# Patient Record
Sex: Female | Born: 2000 | Race: Black or African American | Hispanic: No | Marital: Single | Smoking: Never smoker
Health system: Southern US, Community
[De-identification: ages and names within clinical notes are randomized; demographics above are authoritative.]

## PROBLEM LIST (undated history)

## (undated) DIAGNOSIS — D649 Anemia, unspecified: Secondary | ICD-10-CM

## (undated) DIAGNOSIS — F1290 Cannabis use, unspecified, uncomplicated: Secondary | ICD-10-CM

## (undated) DIAGNOSIS — J45909 Unspecified asthma, uncomplicated: Secondary | ICD-10-CM

## (undated) HISTORY — PX: BREAST BIOPSY: SHX20

---
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
## 2018-04-02 ENCOUNTER — Other Ambulatory Visit: Payer: Self-pay | Admitting: Emergency Medicine

## 2018-04-02 DIAGNOSIS — N63 Unspecified lump in unspecified breast: Secondary | ICD-10-CM

## 2018-04-07 ENCOUNTER — Ambulatory Visit
Admission: RE | Admit: 2018-04-07 | Discharge: 2018-04-07 | Disposition: A | Discharge status: Home / Self Care | Payer: Medicaid Other | Source: Ambulatory Visit | Attending: Emergency Medicine | Admitting: Emergency Medicine

## 2018-04-07 DIAGNOSIS — N63 Unspecified lump in unspecified breast: Secondary | ICD-10-CM | POA: Diagnosis not present

## 2018-04-20 ENCOUNTER — Other Ambulatory Visit: Payer: Self-pay | Admitting: Family

## 2018-04-20 DIAGNOSIS — D241 Benign neoplasm of right breast: Secondary | ICD-10-CM

## 2019-02-09 ENCOUNTER — Other Ambulatory Visit: Payer: Self-pay | Admitting: Family

## 2019-02-09 DIAGNOSIS — N6320 Unspecified lump in the left breast, unspecified quadrant: Secondary | ICD-10-CM

## 2019-02-09 DIAGNOSIS — D241 Benign neoplasm of right breast: Secondary | ICD-10-CM

## 2019-07-18 ENCOUNTER — Encounter (HOSPITAL_COMMUNITY): Payer: Self-pay | Admitting: *Deleted

## 2019-07-18 ENCOUNTER — Other Ambulatory Visit: Payer: Self-pay

## 2019-07-18 ENCOUNTER — Emergency Department (HOSPITAL_COMMUNITY)
Admission: EM | Admit: 2019-07-18 | Discharge: 2019-07-18 | Disposition: A | Discharge status: Home / Self Care | Payer: Medicaid Other | Attending: Emergency Medicine | Admitting: Emergency Medicine

## 2019-07-18 DIAGNOSIS — J029 Acute pharyngitis, unspecified: Secondary | ICD-10-CM | POA: Diagnosis not present

## 2019-07-18 LAB — GROUP A STREP BY PCR: Group A Strep by PCR: NOT DETECTED

## 2019-07-18 MED ORDER — IBUPROFEN 800 MG PO TABS: 800.0000 mg | ORAL_TABLET | Freq: Three times a day (TID) | ORAL | 0 refills | Status: DC | PRN

## 2019-07-18 MED ORDER — AMOXICILLIN 875 MG PO TABS: 875.0000 mg | ORAL_TABLET | Freq: Two times a day (BID) | ORAL | 0 refills | Status: DC

## 2019-07-18 NOTE — ED Triage Notes (Signed)
Sore throat for 2 days no temp until she arrived here lmp 2 14

## 2019-07-18 NOTE — ED Provider Notes (Signed)
[facility] EMERGENCY DEPARTMENT Provider Note   CSN: [id number] Arrival date & time: 07/18/19  1912     History Chief Complaint  Patient presents with  . Sore Throat    Tina Garcia is a 19 y.o. female.  HPI Patient presents to the emergency department with sore throat that started 2 days ago.  The patient states the pain has been increased with swallowing.  The patient states that she feels like her tonsils are swollen.  She states that nothing seems to make the condition better.  Patient states that she feels like she may have had some low-grade fever.  She states she has no other symptoms associated with this.    History reviewed. No pertinent past medical history.  There are no problems to display for this patient.   History reviewed. No pertinent surgical history.   OB History   No obstetric history on file.     No family history on file.  Social History   Tobacco Use  . Smoking status: Never Smoker  . Smokeless tobacco: Never Used  Substance Use Topics  . Alcohol use: No  . Drug use: Not on file    Home Medications Prior to Admission medications   Not on File    Allergies    Patient has no known allergies.  Review of Systems   Review of Systems All other systems negative except as documented in the HPI. All pertinent positives and negatives as reviewed in the HPI. Physical Exam Updated Vital Signs BP 108/79 (BP Location: Left Arm)   Pulse (!) 109   Temp (!) 100.4 F (38 C) (Oral)   Resp 16   Ht 5\' 2"  (1.575 m)   Wt 44.5 kg   SpO2 100%   BMI 17.92 kg/m   Physical Exam Vitals and nursing note reviewed.  Constitutional:      General: She is not in acute distress.    Appearance: She is well-developed.  HENT:     Head: Normocephalic and atraumatic.     Mouth/Throat:     Tonsils: Tonsillar exudate present. 2+ on the right. 2+ on the left.  Eyes:     Pupils: Pupils are equal, round, and reactive to light.   Pulmonary:     Effort: Pulmonary effort is normal.  Skin:    General: Skin is warm and dry.  Neurological:     Mental Status: She is alert and oriented to person, place, and time.     ED Results / Procedures / Treatments   Labs (all labs ordered are listed, but only abnormal results are displayed) Labs Reviewed  GROUP A STREP BY PCR    EKG None  Radiology No results found.  Procedures Procedures (including critical care time)  Medications Ordered in ED Medications - No data to display  ED Course  I have reviewed the triage vital signs and the nursing notes.  Pertinent labs & imaging results that were available during my care of the patient were reviewed by me and considered in my medical decision making (see chart for details).    MDM Rules/Calculators/A&P                      The patient appears to have a pharyngitis that I feel is consistent with a bacterial pharyngitis.  The strep test was negative but I still feel based on the physical exam findings she will need to be treated with antibiotics.  I advised the  patient of the plan and all questions were answered.  Patient agrees and no further questions by the patient. Final Clinical Impression(s) / ED Diagnoses Final diagnoses:  None    Rx / DC Orders ED Discharge Orders    None       Dalia Heading, PA-C 07/18/19 Bushong, Ankit, MD 07/20/19 0120

## 2019-07-18 NOTE — Discharge Instructions (Addendum)
Return here as needed.  Increase your fluid intake.

## 2019-11-30 ENCOUNTER — Other Ambulatory Visit: Payer: Self-pay

## 2019-11-30 ENCOUNTER — Emergency Department
Admission: EM | Admit: 2019-11-30 | Discharge: 2019-11-30 | Disposition: A | Discharge status: Home / Self Care | Payer: Medicaid Other | Attending: Student in an Organized Health Care Education/Training Program | Admitting: Student in an Organized Health Care Education/Training Program

## 2019-11-30 DIAGNOSIS — L0231 Cutaneous abscess of buttock: Secondary | ICD-10-CM | POA: Insufficient documentation

## 2019-11-30 LAB — POCT PREGNANCY, URINE: Preg Test, Ur: NEGATIVE

## 2019-11-30 MED ORDER — SULFAMETHOXAZOLE-TRIMETHOPRIM 800-160 MG PO TABS: 1.0000 | ORAL_TABLET | Freq: Two times a day (BID) | ORAL | 0 refills | Status: DC

## 2019-11-30 MED ORDER — HYDROCODONE-ACETAMINOPHEN 5-325 MG PO TABS: 1.0000 | ORAL_TABLET | Freq: Four times a day (QID) | ORAL | 0 refills | Status: DC | PRN

## 2019-11-30 NOTE — Discharge Instructions (Signed)
Follow-up with your primary care provider return to the emergency department if any severe worsening of your symptoms.  Begin taking sits baths or using warm moist compresses to the area frequently.  Begin taking antibiotics twice a day for the next 10 days and a prescription for pain medication was sent to your pharmacy.  Do not take the pain medication and drive or operate machinery as it could cause drowsiness and increase your risk for injury.  Area should continue to drain and improve.  If any severe worsening of your symptoms or fever, vomiting or inability to take antibiotics return to the emergency department.

## 2019-11-30 NOTE — ED Provider Notes (Signed)
[facility] Emergency Department Provider Note  ____________________________________________   First MD Initiated Contact with Patient 11/30/19 1010     (approximate)  I have reviewed the triage vital signs and the nursing notes.   HISTORY  Chief Complaint Abscess   HPI Tina Garcia is a 19 y.o. female presents to the ED with complaint of boil on her left buttocks for the last several days.  Patient is unaware of whether or not it is draining and she has had a Band-Aid on it the entire time.  She denies any fever, chills, nausea or vomiting.  Recently switched from Depo to the NuvaRing.  Her history indicates that she has not had a period Since 10/13/2019.   History reviewed. No pertinent past medical history.  There are no problems to display for this patient.   History reviewed. No pertinent surgical history.  Prior to Admission medications   Medication Sig Start Date End Date Taking? Authorizing Provider  HYDROcodone-acetaminophen (NORCO/VICODIN) 5-325 MG tablet Take 1 tablet by mouth every 6 (six) hours as needed for moderate pain. 11/30/19   Johnn Hai, PA-C  sulfamethoxazole-trimethoprim (BACTRIM DS) 800-160 MG tablet Take 1 tablet by mouth 2 (two) times daily. 11/30/19   Johnn Hai, PA-C    Allergies Patient has no known allergies.  No family history on file.  Social History Social History   Tobacco Use  . Smoking status: Never Smoker  . Smokeless tobacco: Never Used  Substance Use Topics  . Alcohol use: Yes    Comment: occassionally  . Drug use: Not on file    Review of Systems Constitutional: No fever/chills Eyes: No visual changes. Cardiovascular: Denies chest pain. Respiratory: Denies shortness of breath. Gastrointestinal:  No nausea, no vomiting.  Musculoskeletal: Negative for back pain. Skin: Positive for abscess. Neurological: Negative for headaches, focal weakness or  numbness. ____________________________________________   PHYSICAL EXAM:  VITAL SIGNS: ED Triage Vitals  Enc Vitals Group     BP 11/30/19 0913 114/78     Pulse Rate 11/30/19 0913 (!) 101     Resp 11/30/19 0913 16     Temp 11/30/19 0913 98.4 F (36.9 C)     Temp Source 11/30/19 0913 Oral     SpO2 11/30/19 0913 100 %     Weight 11/30/19 0914 94 lb (42.6 kg)     Height 11/30/19 0914 5\' 2"  (1.575 m)     Head Circumference --      Peak Flow --      Pain Score 11/30/19 0914 10     Pain Loc --      Pain Edu? --      Excl. in [place]? --     Constitutional: Alert and oriented. Well appearing and in no acute distress. Eyes: Conjunctivae are normal.  Head: Atraumatic. Neck: No stridor.   Cardiovascular: Normal rate, regular rhythm. Grossly normal heart sounds.  Good peripheral circulation. Respiratory: Normal respiratory effort.  No retractions. Lungs CTAB. Gastrointestinal: Soft and nontender. No distention.  Musculoskeletal: Is able move upper and lower extremities that any difficulty.  There is an abscess noted on the left buttocks as noted under skin exam.   Neurologic:  Normal speech and language. No gross focal neurologic deficits are appreciated. No gait instability. Skin:  Skin is warm, left buttocks there is a single open pustule with surrounding cellulitis with a circumference of approximately 3 cm.  Area is markedly tender to palpation.  Purulent drainage present. Psychiatric: Mood and  affect are normal. Speech and behavior are normal.  ____________________________________________   LABS (all labs ordered are listed, but only abnormal results are displayed)  Labs Reviewed  POC URINE PREG, ED  POCT PREGNANCY, URINE     PROCEDURES  Procedure(s) performed (including Critical Care):  Procedures   ____________________________________________   INITIAL IMPRESSION / ASSESSMENT AND PLAN / ED COURSE  As part of my medical decision making, I reviewed the following data  within the electronic MEDICAL RECORD NUMBER Notes from prior ED visits and [place] Controlled Substance Database  Cortlyn Cannell was evaluated in Emergency Department on 11/30/2019 for the symptoms described in the history of present illness. She was evaluated in the context of the global COVID-19 pandemic, which necessitated consideration that the patient might be at risk for infection with the SARS-CoV-2 virus that causes COVID-19. Institutional protocols and algorithms that pertain to the evaluation of patients at risk for COVID-19 are in a state of rapid change based on information released by regulatory bodies including the CDC and federal and state organizations. These policies and algorithms were followed during the patient's care in the ED.  19 year old female presents to the ED with complaint of possible abscess to her left buttocks.  Patient states it has been there for couple days.  On exam currently it is draining purulent material.  There is mild cellulitis directly around the pustule.  Patient was made aware that she should begin doing sits baths or just warm moist compresses to the area frequently.  A prescription for Bactrim and Norco was sent to her pharmacy.  She is return to the emergency department if any severe worsening of her symptoms or not improving.    ____________________________________________   FINAL CLINICAL IMPRESSION(S) / ED DIAGNOSES  Final diagnoses:  Abscess of buttock, left     ED Discharge Orders         Ordered    sulfamethoxazole-trimethoprim (BACTRIM DS) 800-160 MG tablet  2 times daily,   Status:  Discontinued     Reprint     11/30/19 1105    HYDROcodone-acetaminophen (NORCO/VICODIN) 5-325 MG tablet  Every 6 hours PRN,   Status:  Discontinued     Reprint     11/30/19 1105    HYDROcodone-acetaminophen (NORCO/VICODIN) 5-325 MG tablet  Every 6 hours PRN     Discontinue  Reprint     11/30/19 1106    sulfamethoxazole-trimethoprim (BACTRIM DS) 800-160 MG tablet   2 times daily     Discontinue  Reprint     11/30/19 1106           Note:  This document was prepared using Dragon voice recognition software and may include unintentional dictation errors.    Johnn Hai, PA-C 11/30/19 1123    Merlyn Lot, MD 11/30/19 1434

## 2019-11-30 NOTE — ED Triage Notes (Signed)
Pt c/o "boil" to the left gluteus for the past couple of days, denies any drainage.

## 2019-11-30 NOTE — ED Notes (Signed)
See triage note Presents with possible abscess to left buttock  Unsure of insect bite  Area is red ,swollen and draining slightly

## 2020-05-29 ENCOUNTER — Other Ambulatory Visit: Payer: Self-pay

## 2020-05-29 ENCOUNTER — Emergency Department
Admission: EM | Admit: 2020-05-29 | Discharge: 2020-05-29 | Disposition: A | Discharge status: Home / Self Care | Payer: Medicaid Other | Attending: Emergency Medicine | Admitting: Emergency Medicine

## 2020-05-29 ENCOUNTER — Encounter: Payer: Self-pay | Admitting: Emergency Medicine

## 2020-05-29 ENCOUNTER — Emergency Department: Payer: Medicaid Other

## 2020-05-29 DIAGNOSIS — R102 Pelvic and perineal pain: Secondary | ICD-10-CM

## 2020-05-29 DIAGNOSIS — R52 Pain, unspecified: Secondary | ICD-10-CM

## 2020-05-29 DIAGNOSIS — E860 Dehydration: Secondary | ICD-10-CM | POA: Insufficient documentation

## 2020-05-29 LAB — LIPASE, BLOOD: Lipase: 38 U/L (ref 11–51)

## 2020-05-29 LAB — COMPREHENSIVE METABOLIC PANEL
ALT: 16 U/L (ref 0–44)
AST: 23 U/L (ref 15–41)
Albumin: 4.4 g/dL (ref 3.5–5.0)
Alkaline Phosphatase: 54 U/L (ref 38–126)
Anion gap: 13 (ref 5–15)
BUN: 13 mg/dL (ref 6–20)
CO2: 23 mmol/L (ref 22–32)
Calcium: 8.9 mg/dL (ref 8.9–10.3)
Chloride: 101 mmol/L (ref 98–111)
Creatinine, Ser: 0.6 mg/dL (ref 0.44–1.00)
GFR, Estimated: >60 mL/min (ref >60)
Glucose, Bld: 104 mg/dL — ABNORMAL HIGH (ref 70–99)
Potassium: 3.7 mmol/L (ref 3.5–5.1)
Sodium: 137 mmol/L (ref 135–145)
Total Bilirubin: 1.2 mg/dL (ref 0.3–1.2)
Total Protein: 8.2 g/dL — ABNORMAL HIGH (ref 6.5–8.1)

## 2020-05-29 LAB — CHLAMYDIA/NGC RT PCR (ARMC ONLY)
Chlamydia Tr: NOT DETECTED
N gonorrhoeae: NOT DETECTED

## 2020-05-29 LAB — WET PREP, GENITAL
Clue Cells Wet Prep HPF POC: NONE SEEN
Sperm: NONE SEEN
Trich, Wet Prep: NONE SEEN
Yeast Wet Prep HPF POC: NONE SEEN

## 2020-05-29 LAB — POC URINE PREG, ED: Preg Test, Ur: NEGATIVE

## 2020-05-29 LAB — URINALYSIS, COMPLETE (UACMP) WITH MICROSCOPIC
Bacteria, UA: NONE SEEN
Bilirubin Urine: NEGATIVE
Glucose, UA: NEGATIVE mg/dL
Ketones, ur: 80 mg/dL — AB
Leukocytes,Ua: NEGATIVE
Nitrite: NEGATIVE
Protein, ur: NEGATIVE mg/dL
Specific Gravity, Urine: 1.035 — ABNORMAL HIGH (ref 1.005–1.030)
pH: 5 (ref 5.0–8.0)

## 2020-05-29 LAB — CBC
HCT: 40.1 % (ref 36.0–46.0)
Hemoglobin: 12.9 g/dL (ref 12.0–15.0)
MCH: 26 pg (ref 26.0–34.0)
MCHC: 32.2 g/dL (ref 30.0–36.0)
MCV: 80.8 fL (ref 80.0–100.0)
Platelets: 378 10*3/uL (ref 150–400)
RBC: 4.96 MIL/uL (ref 3.87–5.11)
RDW: 14 % (ref 11.5–15.5)
WBC: 9 10*3/uL (ref 4.0–10.5)
nRBC: 0 % (ref 0.0–0.2)

## 2020-05-29 MED ORDER — KETOROLAC TROMETHAMINE 30 MG/ML IJ SOLN
10.0000 mg | Freq: Once | INTRAMUSCULAR | Status: AC
  Administered 2020-05-29: 9.9 mg via INTRAVENOUS
  Filled 2020-05-29: qty 1

## 2020-05-29 MED ORDER — DEXTROSE-NACL 5-0.45 % IV SOLN: Freq: Once | INTRAVENOUS | Status: AC

## 2020-05-29 NOTE — ED Triage Notes (Signed)
Pt c/o left and right lower abdominal pain pt [shows the suprapubic area], reports left side worse, sudden onset, sharp pain, having period att  No meds or surgical hx,  Pt reports 2 x 500 mg tylenol 1600 and Midol at 1800 taken without help  No acute distress att

## 2020-05-29 NOTE — ED Provider Notes (Addendum)
[facility] Emergency Department Provider Note   ____________________________________________   Event Date/Time   First MD Initiated Contact with Patient 05/29/20 0402     (approximate)  I have reviewed the triage vital signs and the nursing notes.   HISTORY  Chief Complaint Abdominal Pain    HPI Tina Garcia is a 20 y.o. female who presents to the ED from home with chief complaint of pelvic pain.  Patient states she is on day 2 of 3 of her menstrual cycle which typically lasts approximately 1 week.  Began to experience bilateral pelvic pain yesterday, left greater than right, sudden onset, nonradiating and sharp.  No associated symptoms of fever, chest pain, shortness of breath, nausea, vomiting or dysuria.  Took OTC medicines without relief of symptoms.  Denies vaginal discharge or STD concerns.     Past medical history None  There are no problems to display for this patient.   History reviewed. No pertinent surgical history.  Prior to Admission medications   Medication Sig Start Date End Date Taking? Authorizing Provider  HYDROcodone-acetaminophen (NORCO/VICODIN) 5-325 MG tablet Take 1 tablet by mouth every 6 (six) hours as needed for moderate pain. 11/30/19   Johnn Hai, PA-C  sulfamethoxazole-trimethoprim (BACTRIM DS) 800-160 MG tablet Take 1 tablet by mouth 2 (two) times daily. 11/30/19   Johnn Hai, PA-C    Allergies Patient has no known allergies.  History reviewed. No pertinent family history.  Social History Social History   Tobacco Use  . Smoking status: Never Smoker  . Smokeless tobacco: Never Used  Substance Use Topics  . Alcohol use: Yes    Comment: occassionally    Review of Systems  Constitutional: No fever/chills Eyes: No visual changes. ENT: No sore throat. Cardiovascular: Denies chest pain. Respiratory: Denies shortness of breath. Gastrointestinal: Positive for pelvic pain.  No abdominal pain.   No nausea, no vomiting.  No diarrhea.  No constipation. Genitourinary: Negative for dysuria. Musculoskeletal: Negative for back pain. Skin: Negative for rash. Neurological: Negative for headaches, focal weakness or numbness.   ____________________________________________   PHYSICAL EXAM:  VITAL SIGNS: ED Triage Vitals  Enc Vitals Group     BP 05/29/20 0156 117/84     Pulse Rate 05/29/20 0156 (!) 103     Resp 05/29/20 0156 15     Temp 05/29/20 0156 98.1 F (36.7 C)     Temp Source 05/29/20 0156 Oral     SpO2 05/29/20 0156 100 %     Weight 05/29/20 0156 98 lb (44.5 kg)     Height 05/29/20 0156 5\' 2"  (1.575 m)     Head Circumference --      Peak Flow --      Pain Score 05/29/20 0207 7     Pain Loc --      Pain Edu? --      Excl. in [place]? --     Constitutional: Alert and oriented. Well appearing and in no acute distress.  Texting on cell phone. Eyes: Conjunctivae are normal. PERRL. EOMI. Head: Atraumatic. Nose: No congestion/rhinnorhea. Mouth/Throat: Mucous membranes are moist.  Oropharynx non-erythematous. Neck: No stridor.   Cardiovascular: Normal rate, regular rhythm. Grossly normal heart sounds.  Good peripheral circulation. Respiratory: Normal respiratory effort.  No retractions. Lungs CTAB. Gastrointestinal: Soft and nontender to light or deep palpation. No distention. No abdominal bruits. No CVA tenderness. Musculoskeletal: No lower extremity tenderness nor edema.  No joint effusions. Neurologic:  Normal speech and language. No gross  focal neurologic deficits are appreciated. No gait instability. Skin:  Skin is warm, dry and intact. No rash noted. Psychiatric: Mood and affect are normal. Speech and behavior are normal.  ____________________________________________   LABS (all labs ordered are listed, but only abnormal results are displayed)  Labs Reviewed  WET PREP, GENITAL - Abnormal; Notable for the following components:      Result Value   WBC, Wet Prep HPF  POC FEW (*)    All other components within normal limits  COMPREHENSIVE METABOLIC PANEL - Abnormal; Notable for the following components:   Glucose, Bld 104 (*)    Total Protein 8.2 (*)    All other components within normal limits  URINALYSIS, COMPLETE (UACMP) WITH MICROSCOPIC - Abnormal; Notable for the following components:   Color, Urine YELLOW (*)    APPearance HAZY (*)    Specific Gravity, Urine 1.035 (*)    Hgb urine dipstick SMALL (*)    Ketones, ur 80 (*)    All other components within normal limits  CHLAMYDIA/NGC RT PCR ([facility] ONLY)  LIPASE, BLOOD  CBC  POC URINE PREG, ED   ____________________________________________  EKG  None ____________________________________________  RADIOLOGY I, Leesha Veno J, personally viewed and evaluated these images (plain radiographs) as part of my medical decision making, as well as reviewing the written report by the radiologist.  ED MD interpretation: Unremarkable pelvic ultrasound  Official radiology report(s): US PELVIC COMPLETE W TRANSVAGINAL AND TORSION R/O  Result Date: 05/29/2020 CLINICAL DATA:  20 year old female with suprapubic and pelvic sharp pain greater on the left side x1 day. LMP is now. EXAM: TRANSABDOMINAL AND TRANSVAGINAL ULTRASOUND OF PELVIS DOPPLER ULTRASOUND OF OVARIES TECHNIQUE: Both transabdominal and transvaginal ultrasound examinations of the pelvis were performed. Transabdominal technique was performed for global imaging of the pelvis including uterus, ovaries, adnexal regions, and pelvic cul-de-sac. It was necessary to proceed with endovaginal exam following the transabdominal exam to visualize the ovaries. Color and duplex Doppler ultrasound was utilized to evaluate blood flow to the ovaries. COMPARISON:  CT Abdomen and Pelvis 04/04/2014. FINDINGS: Uterus Measurements: 6.3 x 3.4 x 2.7 cm = volume: 17 mL. Anteflexed uterus (image 3). No fibroids or other mass visualized. Endometrium Thickness: 3 mm.  Superimposed mixed echogenic blood products within the endometrial canal (images 41 and 42). No vascular elements or endometrial mass identified (image 43). Right ovary Measurements: 3.7 x 2.3 x 2.4 cm = volume: 11 mL. Multiple small follicles (image 49). Left ovary Measurements: 2.7 x 2.3 x 2.3 cm = volume: 8 mL. Occasional small follicles. Pulsed Doppler evaluation of both ovaries demonstrates normal low-resistance arterial and venous waveforms. Other findings No pelvic free fluid. IMPRESSION: 1. Mild hematometra felt related to menses. 2. Otherwise physiologic appearance of the female pelvis with no evidence of ovarian mass or torsion. Electronically Signed   By: Genevie Ann M.D.   On: 05/29/2020 05:27    ____________________________________________   PROCEDURES  Procedure(s) performed (including Critical Care):  Procedures   ____________________________________________   INITIAL IMPRESSION / ASSESSMENT AND PLAN / ED COURSE  As part of my medical decision making, I reviewed the following data within the [place] notes reviewed and incorporated, Labs reviewed, Old chart reviewed, Radiograph reviewed and Notes from prior ED visits     20 year old female presenting with bilateral pelvic pain. Differential diagnosis includes, but is not limited to, ovarian cyst, ovarian torsion, acute appendicitis, diverticulitis, urinary tract infection/pyelonephritis, endometriosis, bowel obstruction, colitis, renal colic, gastroenteritis, hernia, fibroids, endometriosis, pregnancy related  pain including ectopic pregnancy, etc.  Laboratory results remarkable for ketonuria.  Will obtain pelvic ultrasound.  Clinical Course as of 05/29/20 4818  [facility] May 29, 2020  0611 Patient feeling better.  Updated her on wet prep and ultrasound results.  Will discharge home after completion of IV fluids.  Strict return precautions given.  Patient verbalizes understanding agrees with plan of care.  [JS]    Clinical Course User Index [JS] Paulette Blanch, MD     ____________________________________________   FINAL CLINICAL IMPRESSION(S) / ED DIAGNOSES  Final diagnoses:  Pain  Pelvic pain in female  Dehydration     ED Discharge Orders    None      *Please note:  Tina Garcia was evaluated in Emergency Department on 05/29/2020 for the symptoms described in the history of present illness. She was evaluated in the context of the global COVID-19 pandemic, which necessitated consideration that the patient might be at risk for infection with the SARS-CoV-2 virus that causes COVID-19. Institutional protocols and algorithms that pertain to the evaluation of patients at risk for COVID-19 are in a state of rapid change based on information released by regulatory bodies including the CDC and federal and state organizations. These policies and algorithms were followed during the patient's care in the ED.  Some ED evaluations and interventions may be delayed as a result of limited staffing during and the pandemic.*   Note:  This document was prepared using Dragon voice recognition software and may include unintentional dictation errors.   Paulette Blanch, MD 05/29/20 Pecola Lawless    Paulette Blanch, MD 05/29/20 0700

## 2020-05-29 NOTE — Discharge Instructions (Addendum)
Drink plenty of fluids daily.  Return to the ER for worsening symptoms, persistent vomiting, difficulty breathing or other concerns. °

## 2021-08-17 ENCOUNTER — Emergency Department
Admission: EM | Admit: 2021-08-17 | Discharge: 2021-08-17 | Disposition: A | Discharge status: Home / Self Care | Payer: Medicaid Other | Attending: Emergency Medicine | Admitting: Emergency Medicine

## 2021-08-17 ENCOUNTER — Emergency Department: Payer: Medicaid Other

## 2021-08-17 ENCOUNTER — Other Ambulatory Visit: Payer: Self-pay

## 2021-08-17 DIAGNOSIS — R1030 Lower abdominal pain, unspecified: Secondary | ICD-10-CM | POA: Diagnosis present

## 2021-08-17 DIAGNOSIS — R102 Pelvic and perineal pain: Secondary | ICD-10-CM | POA: Insufficient documentation

## 2021-08-17 LAB — CBC
HCT: 38 % (ref 36.0–46.0)
Hemoglobin: 11.9 g/dL — ABNORMAL LOW (ref 12.0–15.0)
MCH: 25.6 pg — ABNORMAL LOW (ref 26.0–34.0)
MCHC: 31.3 g/dL (ref 30.0–36.0)
MCV: 81.7 fL (ref 80.0–100.0)
Platelets: 355 10*3/uL (ref 150–400)
RBC: 4.65 MIL/uL (ref 3.87–5.11)
RDW: 14.1 % (ref 11.5–15.5)
WBC: 7.1 10*3/uL (ref 4.0–10.5)
nRBC: 0 % (ref 0.0–0.2)

## 2021-08-17 LAB — URINALYSIS, ROUTINE W REFLEX MICROSCOPIC
Bilirubin Urine: NEGATIVE
Glucose, UA: NEGATIVE mg/dL
Hgb urine dipstick: NEGATIVE
Ketones, ur: 20 mg/dL — AB
Leukocytes,Ua: NEGATIVE
Nitrite: NEGATIVE
Protein, ur: NEGATIVE mg/dL
Specific Gravity, Urine: 1.021 (ref 1.005–1.030)
pH: 6 (ref 5.0–8.0)

## 2021-08-17 LAB — COMPREHENSIVE METABOLIC PANEL
ALT: 18 U/L (ref 0–44)
AST: 32 U/L (ref 15–41)
Albumin: 4.3 g/dL (ref 3.5–5.0)
Alkaline Phosphatase: 42 U/L (ref 38–126)
Anion gap: 14 (ref 5–15)
BUN: 12 mg/dL (ref 6–20)
CO2: 24 mmol/L (ref 22–32)
Calcium: 9.3 mg/dL (ref 8.9–10.3)
Chloride: 102 mmol/L (ref 98–111)
Creatinine, Ser: 0.7 mg/dL (ref 0.44–1.00)
GFR, Estimated: >60 mL/min (ref >60)
Glucose, Bld: 73 mg/dL (ref 70–99)
Potassium: 3.7 mmol/L (ref 3.5–5.1)
Sodium: 140 mmol/L (ref 135–145)
Total Bilirubin: 0.8 mg/dL (ref 0.3–1.2)
Total Protein: 8 g/dL (ref 6.5–8.1)

## 2021-08-17 LAB — WET PREP, GENITAL
Clue Cells Wet Prep HPF POC: NONE SEEN
Sperm: NONE SEEN
Trich, Wet Prep: NONE SEEN
WBC, Wet Prep HPF POC: <10 (ref <10)
Yeast Wet Prep HPF POC: NONE SEEN

## 2021-08-17 LAB — CHLAMYDIA/NGC RT PCR (ARMC ONLY)
Chlamydia Tr: NOT DETECTED
N gonorrhoeae: NOT DETECTED

## 2021-08-17 LAB — LIPASE, BLOOD: Lipase: 32 U/L (ref 11–51)

## 2021-08-17 MED ORDER — IOHEXOL 300 MG/ML  SOLN
75.0000 mL | Freq: Once | INTRAMUSCULAR | Status: AC | PRN
  Administered 2021-08-17: 75 mL via INTRAVENOUS

## 2021-08-17 NOTE — ED Notes (Signed)
POC pregnancy test negative.

## 2021-08-17 NOTE — ED Triage Notes (Signed)
Pt c/o lower abd pain with N/V for the past 2 days. Pt is in NAD on arrival ?

## 2021-08-17 NOTE — ED Provider Notes (Signed)
? ?[facility] ?Provider Note ? ? ? Event Date/Time  ? First MD Initiated Contact with Patient 08/17/21 1001   ?  (approximate) ? ? ?History  ? ?Abdominal Pain ? ? ?HPI ? ?Tina Garcia is a 21 y.o. female with no significant past medical history who presents with complaints of lower abdominal pain.  She reports the pain has been ongoing for several days now.  She reports it is mostly constant.  She denies vaginal discharge.  No vaginal bleeding.  No dysuria.  No frequency.  No fevers chills nausea or vomiting.  No history of abdominal surgery. ?  ? ? ?Physical Exam  ? ?Triage Vital Signs: ?ED Triage Vitals [08/17/21 0957]  ?Enc Vitals Group  ?   BP 99/67  ?   Pulse Rate (!) 107  ?   Resp 17  ?   Temp 98.1 ?F (36.7 ?C)  ?   Temp Source Oral  ?   SpO2 97 %  ?   Weight 44.9 kg (99 lb)  ?   Height 1.575 m ('5\' 2"'$ )  ?   Head Circumference   ?   Peak Flow   ?   Pain Score 6  ?   Pain Loc   ?   Pain Edu?   ?   Excl. in [place]?   ? ? ?Most recent vital signs: ?Vitals:  ? 08/17/21 0957 08/17/21 1516  ?BP: 99/67 (!) 104/54  ?Pulse: (!) 107 95  ?Resp: 17 16  ?Temp: 98.1 ?F (36.7 ?C)   ?SpO2: 97% 100%  ? ? ? ?General: Awake, no distress.  ?CV:  Good peripheral perfusion.  ?Resp:  Normal effort.  ?Abd:  No distention.  Mild tenderness right lower quadrant ?Other:  GU: No CMT, thin white discharge no odor ? ? ?ED Results / Procedures / Treatments  ? ?Labs ?(all labs ordered are listed, but only abnormal results are displayed) ?Labs Reviewed  ?CBC - Abnormal; Notable for the following components:  ?    Result Value  ? Hemoglobin 11.9 (*)   ? MCH 25.6 (*)   ? All other components within normal limits  ?URINALYSIS, ROUTINE W REFLEX MICROSCOPIC - Abnormal; Notable for the following components:  ? Color, Urine YELLOW (*)   ? APPearance HAZY (*)   ? Ketones, ur 20 (*)   ? All other components within normal limits  ?WET PREP, GENITAL  ?CHLAMYDIA/NGC RT PCR ([facility] ONLY)            ?COMPREHENSIVE METABOLIC PANEL   ?LIPASE, BLOOD  ? ? ? ?EKG ? ? ? ? ?RADIOLOGY ?CT abdomen pelvis reviewed by me, no acute abnormality ?Pelvic ultrasound ? ? ? ?PROCEDURES: ? ?Critical Care performed:  ? ?Procedures ? ? ?MEDICATIONS ORDERED IN ED: ?Medications  ?iohexol (OMNIPAQUE) 300 MG/ML solution 75 mL (75 mLs Intravenous Contrast Given 08/17/21 1123)  ? ? ? ?IMPRESSION / MDM / ASSESSMENT AND PLAN / ED COURSE  ?I reviewed the triage vital signs and the nursing notes. ? ? ? ?Patient presents with lower abdominal pain as detailed above.  She does appear to be tender in the right lower quadrant raising suspicion for appendicitis.  Differential also includes ovarian cyst, UTI, kidney stone, pelvic pain ? ?We will obtain labs, urine, pregnancy test, CT abdomen pelvis and reevaluate ? ?Lab work is overall reassuring, urinalysis is unremarkable, CT abdomen pelvis without acute abnormality. ? ?Sent for pelvic ultrasound which is reassuring. ? ?Wet prep GC chlamydia negative.  Patient is feeling improved and anxious to leave.  I will refer her to gynecology, she knows she can return anytime as needed ? ? ? ?  ? ? ?FINAL CLINICAL IMPRESSION(S) / ED DIAGNOSES  ? ?Final diagnoses:  ?Pelvic pain  ? ? ? ?Rx / DC Orders  ? ?ED Discharge Orders   ? ? None  ? ?  ? ? ? ?Note:  This document was prepared using Dragon voice recognition software and may include unintentional dictation errors. ?  ?Lavonia Drafts, MD ?08/17/21 1527 ? ?

## 2021-11-09 ENCOUNTER — Inpatient Hospital Stay: Admission: RE | Admit: 2021-11-09 | Payer: Medicaid Other | Source: Ambulatory Visit

## 2021-11-13 ENCOUNTER — Inpatient Hospital Stay: Admission: RE | Admit: 2021-11-13 | Payer: Medicaid Other | Source: Ambulatory Visit

## 2021-11-14 ENCOUNTER — Inpatient Hospital Stay: Admission: RE | Admit: 2021-11-14 | Payer: Medicaid Other | Source: Ambulatory Visit

## 2021-11-16 ENCOUNTER — Encounter: Admission: RE | Payer: Self-pay | Source: Home / Self Care

## 2021-11-16 ENCOUNTER — Ambulatory Visit
Admission: RE | Admit: 2021-11-16 | Payer: Medicaid Other | Source: Home / Self Care | Admitting: Obstetrics and Gynecology

## 2021-11-16 DIAGNOSIS — O021 Missed abortion: Secondary | ICD-10-CM

## 2021-11-16 SURGERY — DILATION AND EVACUATION, UTERUS
Anesthesia: Choice
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
## 2022-05-06 ENCOUNTER — Encounter: Payer: Self-pay | Admitting: *Deleted

## 2022-05-06 ENCOUNTER — Encounter
Admission: RE | Admit: 2022-05-06 | Discharge: 2022-05-06 | Disposition: A | Discharge status: Home / Self Care | Payer: Medicaid Other | Source: Ambulatory Visit | Attending: Obstetrics and Gynecology | Admitting: Obstetrics and Gynecology

## 2022-05-06 HISTORY — DX: Unspecified asthma, uncomplicated: J45.909

## 2022-05-06 HISTORY — DX: Anemia, unspecified: D64.9

## 2022-05-06 HISTORY — DX: Cannabis use, unspecified, uncomplicated: F12.90

## 2022-05-06 NOTE — Patient Instructions (Signed)
Your procedure is scheduled on:05-09-22 Thursday Report to the Registration Desk on the 1st floor of the [facility].Then proceed to the 2nd floor Surgery Desk To find out your arrival time, please call [phone] between 1PM - 3PM on:05-08-22 Wednesday If your arrival time is 6:00 am, do not arrive prior to that time as the [facility] entrance doors do not open until 6:00 am.  REMEMBER: Instructions that are not followed completely may result in serious medical risk, up to and including death; or upon the discretion of your surgeon and anesthesiologist your surgery may need to be rescheduled.  Do not eat food Or drink any liquids after midnight the night before surgery.  No gum chewing, lozengers or hard candies.  In addition, your doctor has ordered for you to drink the provided  Ensure Pre-Surgery Clear Carbohydrate Drink Drinking this carbohydrate drink up to two hours before surgery helps to reduce insulin resistance and improve patient outcomes. Please complete drinking 2 hours prior to scheduled arrival time.  Do NOT take any medication the day of surgery  One week prior to surgery: Stop Anti-inflammatories (NSAIDS) such as Advil, Aleve, Ibuprofen, Motrin, Naproxen, Naprosyn and Aspirin based products such as Excedrin, Goodys Powder, BC Powder.You may however, continue to take Tylenol if needed for pain up until the day of surgery.  Stop ANY OVER THE COUNTER supplements/vitamins NOW (05-06-22) until after surgery. Continue your Iron up until the day prior to surgery  No Alcohol for 24 hours before or after surgery.  No Smoking including e-cigarettes for 24 hours prior to surgery.  No chewable tobacco products for at least 6 hours prior to surgery.  No nicotine patches on the day of surgery.  Do not use any "recreational" drugs for at least a week prior to your surgery.  Please be advised that the combination of cocaine and anesthesia may have negative outcomes, up to and  including death. If you test positive for cocaine, your surgery will be cancelled.  On the morning of surgery brush your teeth with toothpaste and water, you may rinse your mouth with mouthwash if you wish. Do not swallow any toothpaste or mouthwash.  Do not wear jewelry, make-up, hairpins, clips or nail polish.  Do not wear lotions, powders, or perfumes.   Do not shave body from the neck down 48 hours prior to surgery just in case you cut yourself which could leave a site for infection.  Also, freshly shaved skin may become irritated if using the CHG soap.  Contact lenses, hearing aids and dentures may not be worn into surgery.  Do not bring valuables to the hospital. [facility] is not responsible for any missing/lost belongings or valuables.   Notify your doctor if there is any change in your medical condition (cold, fever, infection).  Wear comfortable clothing (specific to your surgery type) to the hospital.  After surgery, you can help prevent lung complications by doing breathing exercises.  Take deep breaths and cough every 1-2 hours. Your doctor may order a device called an Incentive Spirometer to help you take deep breaths. When coughing or sneezing, hold a pillow firmly against your incision with both hands. This is called "splinting." Doing this helps protect your incision. It also decreases belly discomfort.  If you are being admitted to the hospital overnight, leave your suitcase in the car. After surgery it may be brought to your room.  If you are being discharged the day of surgery, you will not be allowed to  drive home. You will need a responsible adult (18 years or older) to drive you home and stay with you that night.   If you are taking public transportation, you will need to have a responsible adult (18 years or older) with you. Please confirm with your physician that it is acceptable to use public transportation.   Please call the [facility].  at [phone] if you have any questions about these instructions.  Surgery Visitation Policy:  Patients undergoing a surgery or procedure may have two family members or support persons with them as long as the person is not COVID-19 positive or experiencing its symptoms.   Due to an increase in RSV and influenza rates and associated hospitalizations, children ages 60 and under will not be able to visit patients in [facility]. Masks continue to be strongly recommended.   How to Use an Incentive Spirometer An incentive spirometer is a tool that measures how well you are filling your lungs with each breath. Learning to take long, deep breaths using this tool can help you keep your lungs clear and active. This may help to reverse or lessen your chance of developing breathing (pulmonary) problems, especially infection. You may be asked to use a spirometer: After a surgery. If you have a lung problem or a history of smoking. After a long period of time when you have been unable to move or be active. If the spirometer includes an indicator to show the highest number that you have reached, your health care provider or respiratory therapist will help you set a goal. Keep a log of your progress as told by your health care provider. What are the risks? Breathing too quickly may cause dizziness or cause you to pass out. Take your time so you do not get dizzy or light-headed. If you are in pain, you may need to take pain medicine before doing incentive spirometry. It is harder to take a deep breath if you are having pain. How to use your incentive spirometer  Sit up on the edge of your bed or on a chair. Hold the incentive spirometer so that it is in an upright position. Before you use the spirometer, breathe out normally. Place the mouthpiece in your mouth. Make sure your lips are closed tightly around it. Breathe in slowly and as deeply as you can through your mouth, causing the piston  or the ball to rise toward the top of the chamber. Hold your breath for 3-5 seconds, or for as long as possible. If the spirometer includes a coach indicator, use this to guide you in breathing. Slow down your breathing if the indicator goes above the marked areas. Remove the mouthpiece from your mouth and breathe out normally. The piston or ball will return to the bottom of the chamber. Rest for a few seconds, then repeat the steps 10 or more times. Take your time and take a few normal breaths between deep breaths so that you do not get dizzy or light-headed. Do this every 1-2 hours when you are awake. If the spirometer includes a goal marker to show the highest number you have reached (best effort), use this as a goal to work toward during each repetition. After each set of 10 deep breaths, cough a few times. This will help to make sure that your lungs are clear. If you have an incision on your chest or abdomen from surgery, place a pillow or a rolled-up towel firmly against the incision when  you cough. This can help to reduce pain while taking deep breaths and coughing. General tips When you are able to get out of bed: Walk around often. Continue to take deep breaths and cough in order to clear your lungs. Keep using the incentive spirometer until your health care provider says it is okay to stop using it. If you have been in the hospital, you may be told to keep using the spirometer at home. Contact a health care provider if: You are having difficulty using the spirometer. You have trouble using the spirometer as often as instructed. Your pain medicine is not giving enough relief for you to use the spirometer as told. You have a fever. Get help right away if: You develop shortness of breath. You develop a cough with bloody mucus from the lungs. You have fluid or blood coming from an incision site after you cough. Summary An incentive spirometer is a tool that can help you learn to take  long, deep breaths to keep your lungs clear and active. You may be asked to use a spirometer after a surgery, if you have a lung problem or a history of smoking, or if you have been inactive for a long period of time. Use your incentive spirometer as instructed every 1-2 hours while you are awake. If you have an incision on your chest or abdomen, place a pillow or a rolled-up towel firmly against your incision when you cough. This will help to reduce pain. Get help right away if you have shortness of breath, you cough up bloody mucus, or blood comes from your incision when you cough. This information is not intended to replace advice given to you by your health care provider. Make sure you discuss any questions you have with your health care provider. Document Revised: 06/28/2019 Document Reviewed: 06/28/2019 Elsevier Patient Education  [employer].

## 2022-05-07 ENCOUNTER — Encounter
Admission: RE | Admit: 2022-05-07 | Discharge: 2022-05-07 | Disposition: A | Discharge status: Home / Self Care | Payer: Medicaid Other | Source: Ambulatory Visit | Attending: Obstetrics and Gynecology | Admitting: Obstetrics and Gynecology

## 2022-05-07 DIAGNOSIS — Z01812 Encounter for preprocedural laboratory examination: Secondary | ICD-10-CM | POA: Diagnosis not present

## 2022-05-07 DIAGNOSIS — Z01818 Encounter for other preprocedural examination: Secondary | ICD-10-CM

## 2022-05-07 LAB — CBC
HCT: 33.4 % — ABNORMAL LOW (ref 36.0–46.0)
Hemoglobin: 9.7 g/dL — ABNORMAL LOW (ref 12.0–15.0)
MCH: 23.3 pg — ABNORMAL LOW (ref 26.0–34.0)
MCHC: 29 g/dL — ABNORMAL LOW (ref 30.0–36.0)
MCV: 80.3 fL (ref 80.0–100.0)
Platelets: 356 10*3/uL (ref 150–400)
RBC: 4.16 MIL/uL (ref 3.87–5.11)
RDW: 19.9 % — ABNORMAL HIGH (ref 11.5–15.5)
WBC: 4.5 10*3/uL (ref 4.0–10.5)
nRBC: 0 % (ref 0.0–0.2)

## 2022-05-07 LAB — TYPE AND SCREEN
ABO/RH(D): A POS
Antibody Screen: NEGATIVE
Extend sample reason: UNDETERMINED

## 2022-05-08 MED ORDER — DEXTROSE 5 % IV SOLN
200.0000 mg | INTRAVENOUS | Status: AC
  Administered 2022-05-09: 200 mg via INTRAVENOUS
  Filled 2022-05-08: qty 200

## 2022-05-08 MED ORDER — ACETAMINOPHEN 500 MG PO TABS: 1000.0000 mg | ORAL_TABLET | ORAL | Status: AC

## 2022-05-08 MED ORDER — FAMOTIDINE 20 MG PO TABS: 20.0000 mg | ORAL_TABLET | Freq: Once | ORAL | Status: AC

## 2022-05-08 MED ORDER — ORAL CARE MOUTH RINSE: 15.0000 mL | Freq: Once | OROMUCOSAL | Status: AC

## 2022-05-08 MED ORDER — GABAPENTIN 300 MG PO CAPS: 300.0000 mg | ORAL_CAPSULE | ORAL | Status: AC

## 2022-05-08 MED ORDER — CHLORHEXIDINE GLUCONATE 0.12 % MT SOLN: 15.0000 mL | Freq: Once | OROMUCOSAL | Status: AC

## 2022-05-08 MED ORDER — LACTATED RINGERS IV SOLN: INTRAVENOUS | Status: DC

## 2022-05-08 MED ORDER — POVIDONE-IODINE 10 % EX SWAB: 2.0000 | Freq: Once | CUTANEOUS | Status: DC

## 2022-05-09 ENCOUNTER — Encounter
Admission: RE | Disposition: A | Discharge status: Home / Self Care | Payer: Self-pay | Source: Home / Self Care | Attending: Obstetrics and Gynecology

## 2022-05-09 ENCOUNTER — Encounter: Payer: Self-pay | Admitting: Obstetrics and Gynecology

## 2022-05-09 ENCOUNTER — Other Ambulatory Visit: Payer: Self-pay

## 2022-05-09 ENCOUNTER — Ambulatory Visit
Admission: RE | Admit: 2022-05-09 | Discharge: 2022-05-09 | Disposition: A | Discharge status: Home / Self Care | Payer: Medicaid Other | Attending: Obstetrics and Gynecology | Admitting: Obstetrics and Gynecology

## 2022-05-09 ENCOUNTER — Ambulatory Visit: Payer: Medicaid Other | Admitting: Anesthesiology

## 2022-05-09 ENCOUNTER — Ambulatory Visit: Payer: Medicaid Other | Admitting: Urgent Care

## 2022-05-09 DIAGNOSIS — O034 Incomplete spontaneous abortion without complication: Secondary | ICD-10-CM | POA: Diagnosis present

## 2022-05-09 DIAGNOSIS — J45909 Unspecified asthma, uncomplicated: Secondary | ICD-10-CM | POA: Diagnosis not present

## 2022-05-09 DIAGNOSIS — Z793 Long term (current) use of hormonal contraceptives: Secondary | ICD-10-CM | POA: Diagnosis not present

## 2022-05-09 DIAGNOSIS — Z01818 Encounter for other preprocedural examination: Secondary | ICD-10-CM

## 2022-05-09 HISTORY — PX: DILATION AND EVACUATION: SHX1459

## 2022-05-09 LAB — ABO/RH: ABO/RH(D): A POS

## 2022-05-09 SURGERY — DILATION AND EVACUATION, UTERUS
Anesthesia: General

## 2022-05-09 MED ORDER — METHYLERGONOVINE MALEATE 0.2 MG/ML IJ SOLN
INTRAMUSCULAR | Status: AC
  Filled 2022-05-09: qty 1

## 2022-05-09 MED ORDER — PROPOFOL 10 MG/ML IV BOLUS
INTRAVENOUS | Status: DC | PRN
  Administered 2022-05-09: 100 mg via INTRAVENOUS
  Administered 2022-05-09: 30 mg via INTRAVENOUS

## 2022-05-09 MED ORDER — DEXMEDETOMIDINE HCL IN NACL 200 MCG/50ML IV SOLN
INTRAVENOUS | Status: DC | PRN
  Administered 2022-05-09: 12 ug via INTRAVENOUS

## 2022-05-09 MED ORDER — ONDANSETRON HCL 4 MG/2ML IJ SOLN
INTRAMUSCULAR | Status: DC | PRN
  Administered 2022-05-09 (×2): 4 mg via INTRAVENOUS

## 2022-05-09 MED ORDER — MIDAZOLAM HCL 2 MG/2ML IJ SOLN
INTRAMUSCULAR | Status: DC | PRN
  Administered 2022-05-09: 2 mg via INTRAVENOUS

## 2022-05-09 MED ORDER — FAMOTIDINE 20 MG PO TABS
ORAL_TABLET | ORAL | Status: AC
  Administered 2022-05-09: 20 mg via ORAL
  Filled 2022-05-09: qty 1

## 2022-05-09 MED ORDER — ACETAMINOPHEN 500 MG PO TABS
ORAL_TABLET | ORAL | Status: AC
  Administered 2022-05-09: 1000 mg via ORAL
  Filled 2022-05-09: qty 2

## 2022-05-09 MED ORDER — DEXAMETHASONE SODIUM PHOSPHATE 10 MG/ML IJ SOLN
INTRAMUSCULAR | Status: DC | PRN
  Administered 2022-05-09: 10 mg via INTRAVENOUS

## 2022-05-09 MED ORDER — HYDROMORPHONE HCL 1 MG/ML IJ SOLN: 0.2500 mg | INTRAMUSCULAR | Status: DC | PRN

## 2022-05-09 MED ORDER — OXYCODONE HCL 5 MG PO TABS: 5.0000 mg | ORAL_TABLET | Freq: Once | ORAL | Status: DC | PRN

## 2022-05-09 MED ORDER — FENTANYL CITRATE (PF) 100 MCG/2ML IJ SOLN
INTRAMUSCULAR | Status: AC
  Filled 2022-05-09: qty 2

## 2022-05-09 MED ORDER — LIDOCAINE HCL (CARDIAC) PF 100 MG/5ML IV SOSY
PREFILLED_SYRINGE | INTRAVENOUS | Status: DC | PRN
  Administered 2022-05-09: 60 mg via INTRAVENOUS

## 2022-05-09 MED ORDER — CHLORHEXIDINE GLUCONATE 0.12 % MT SOLN
OROMUCOSAL | Status: AC
  Administered 2022-05-09: 15 mL via OROMUCOSAL
  Filled 2022-05-09: qty 15

## 2022-05-09 MED ORDER — GLYCOPYRROLATE 0.2 MG/ML IJ SOLN
INTRAMUSCULAR | Status: DC | PRN
  Administered 2022-05-09: .2 mg via INTRAVENOUS

## 2022-05-09 MED ORDER — 0.9 % SODIUM CHLORIDE (POUR BTL) OPTIME
TOPICAL | Status: DC | PRN
  Administered 2022-05-09: 10 mL

## 2022-05-09 MED ORDER — GABAPENTIN 300 MG PO CAPS
ORAL_CAPSULE | ORAL | Status: AC
  Administered 2022-05-09: 300 mg via ORAL
  Filled 2022-05-09: qty 1

## 2022-05-09 MED ORDER — MIDAZOLAM HCL 2 MG/2ML IJ SOLN
INTRAMUSCULAR | Status: AC
  Filled 2022-05-09: qty 2

## 2022-05-09 MED ORDER — OXYCODONE HCL 5 MG/5ML PO SOLN: 5.0000 mg | Freq: Once | ORAL | Status: DC | PRN

## 2022-05-09 MED ORDER — KETOROLAC TROMETHAMINE 30 MG/ML IJ SOLN
INTRAMUSCULAR | Status: DC | PRN
  Administered 2022-05-09: 30 mg via INTRAVENOUS

## 2022-05-09 MED ORDER — FENTANYL CITRATE (PF) 100 MCG/2ML IJ SOLN
INTRAMUSCULAR | Status: DC | PRN
  Administered 2022-05-09: 25 ug via INTRAVENOUS
  Administered 2022-05-09: 50 ug via INTRAVENOUS

## 2022-05-09 SURGICAL SUPPLY — 28 items
DRSG TELFA 3X8 NADH STRL (GAUZE/BANDAGES/DRESSINGS) IMPLANT
FILTER UTR ASPR ASSEMBLY (MISCELLANEOUS) ×1 IMPLANT
FILTER UTR ASPR SPEC (MISCELLANEOUS) ×1 IMPLANT
FLTR UTR ASPR SPEC (MISCELLANEOUS) ×1
GLOVE SURG SYN 8.0 (GLOVE) ×1 IMPLANT
GLOVE SURG SYN 8.0 PF PI (GLOVE) ×1 IMPLANT
GOWN STRL REUS W/ TWL LRG LVL3 (GOWN DISPOSABLE) ×1 IMPLANT
GOWN STRL REUS W/ TWL XL LVL3 (GOWN DISPOSABLE) ×1 IMPLANT
GOWN STRL REUS W/TWL LRG LVL3 (GOWN DISPOSABLE) ×1
GOWN STRL REUS W/TWL XL LVL3 (GOWN DISPOSABLE) ×1
KIT BERKELEY 1ST TRIMESTER 3/8 (MISCELLANEOUS) ×1 IMPLANT
KIT TURNOVER CYSTO (KITS) ×1 IMPLANT
MANIFOLD NEPTUNE II (INSTRUMENTS) ×1 IMPLANT
PACK DNC HYST (MISCELLANEOUS) ×1 IMPLANT
PAD OB MATERNITY 4.3X12.25 (PERSONAL CARE ITEMS) ×1 IMPLANT
PAD PREP 24X41 OB/GYN DISP (PERSONAL CARE ITEMS) ×1 IMPLANT
SCRUB CHG 4% DYNA-HEX 4OZ (MISCELLANEOUS) ×1 IMPLANT
SET BERKELEY SUCTION TUBING (SUCTIONS) ×1 IMPLANT
SET CYSTO W/LG BORE CLAMP LF (SET/KITS/TRAYS/PACK) IMPLANT
TOWEL OR 17X26 4PK STRL BLUE (TOWEL DISPOSABLE) ×1 IMPLANT
TRAP FLUID SMOKE EVACUATOR (MISCELLANEOUS) ×1 IMPLANT
TRAP TISSUE FILTER (MISCELLANEOUS) ×1 IMPLANT
VACURETTE 10 RIGID CVD (CANNULA) ×1 IMPLANT
VACURETTE 6 ASPIR F TIP BERK (CANNULA) ×1 IMPLANT
VACURETTE 7MM F TIP STRL (CANNULA) ×1 IMPLANT
VACURETTE 8 RIGID CVD (CANNULA) ×1 IMPLANT
VACURETTE 8MM F TIP (MISCELLANEOUS) ×1 IMPLANT
WATER STERILE IRR 500ML POUR (IV SOLUTION) ×1 IMPLANT

## 2022-05-09 NOTE — Brief Op Note (Signed)
05/09/2022  11:13 AM  PATIENT:  Tina Garcia  22 y.o. female  PRE-OPERATIVE DIAGNOSIS:  rretained products s/p abortion  POST-OPERATIVE DIAGNOSIS:same   PROCEDURE:  Procedure(s): SUCTION DILATATION AND CURETTAGE (N/A)  SURGEON:  Surgeon(s) and Role:    * Lola Czerwonka, Gwen Her, MD - Primary  PHYSICIAN ASSISTANT:   ASSISTANTS: none   ANESTHESIA:   general  EBL:  5 mL , IOF 100  uo 200 cc  BLOOD ADMINISTERED:none  DRAINS: none   LOCAL MEDICATIONS USED:  NONE  SPECIMEN:  Source of Specimen:  POC  DISPOSITION OF SPECIMEN:  PATHOLOGY  COUNTS:  YES  TOURNIQUET:  * No tourniquets in log *  DICTATION: .Other Dictation: Dictation Number verbal   PLAN OF CARE: Discharge to home after PACU  PATIENT DISPOSITION:  PACU - hemodynamically stable.   Delay start of Pharmacological VTE agent (>24hrs) due to surgical blood loss or risk of bleeding: not applicable

## 2022-05-09 NOTE — Anesthesia Preprocedure Evaluation (Addendum)
Anesthesia Evaluation  Patient identified by MRN, date of birth, ID band Patient awake    Reviewed: Allergy & Precautions, NPO status , Patient's Chart, lab work & pertinent test results  Airway Mallampati: II  TM Distance: >3 FB Neck ROM: full    Dental no notable dental hx.    Pulmonary neg pulmonary ROS   Pulmonary exam normal        Cardiovascular negative cardio ROS Normal cardiovascular exam     Neuro/Psych negative neurological ROS  negative psych ROS   GI/Hepatic negative GI ROS, Neg liver ROS,,,  Endo/Other  negative endocrine ROS    Renal/GU      Musculoskeletal   Abdominal   Peds  Hematology negative hematology ROS (+)   Anesthesia Other Findings Past Medical History: No date: Anemia No date: Asthma     Comment:  as child no inhalers No date: Marijuana use  Past Surgical History: No date: BREAST BIOPSY; Bilateral  BMI    Body Mass Index: 18.10 kg/m      Reproductive/Obstetrics negative OB ROS                              Anesthesia Physical Anesthesia Plan  ASA: 1  Anesthesia Plan: General LMA   Post-op Pain Management: Ofirmev IV (intra-op)* and Toradol IV (intra-op)*   Induction: Intravenous  PONV Risk Score and Plan: Dexamethasone, Ondansetron, Midazolam and Treatment may vary due to age or medical condition  Airway Management Planned: LMA  Additional Equipment:   Intra-op Plan:   Post-operative Plan: Extubation in OR  Informed Consent: I have reviewed the patients History and Physical, chart, labs and discussed the procedure including the risks, benefits and alternatives for the proposed anesthesia with the patient or authorized representative who has indicated his/her understanding and acceptance.     Dental Advisory Given  Plan Discussed with: Anesthesiologist, CRNA and Surgeon  Anesthesia Plan Comments: (Patient consented for risks of  anesthesia including but not limited to:  - adverse reactions to medications - damage to eyes, teeth, lips or other oral mucosa - nerve damage due to positioning  - sore throat or hoarseness - Damage to heart, brain, nerves, lungs, other parts of body or loss of life  Patient voiced understanding.)         Anesthesia Quick Evaluation

## 2022-05-09 NOTE — Progress Notes (Signed)
Pt here for Suction D+C for retained POC s/p termination . LAbs reviewed  All questions answered . Proceed

## 2022-05-09 NOTE — Anesthesia Postprocedure Evaluation (Signed)
Anesthesia Post Note  Patient: Tina Garcia  Procedure(s) Performed: SUCTION DILATATION AND CURETTAGE  Patient location during evaluation: PACU Anesthesia Type: General Level of consciousness: awake and alert Pain management: pain level controlled Vital Signs Assessment: post-procedure vital signs reviewed and stable Respiratory status: spontaneous breathing, nonlabored ventilation, respiratory function stable and patient connected to nasal cannula oxygen Cardiovascular status: blood pressure returned to baseline and stable Postop Assessment: no apparent nausea or vomiting Anesthetic complications: no   No notable events documented.   Last Vitals:  Vitals:   05/09/22 1215 05/09/22 1225  BP:  101/74  Pulse: 81 97  Resp:  16  Temp:  36.5 C  SpO2: 100% 97%    Last Pain:  Vitals:   05/09/22 1225  TempSrc: Temporal  PainSc: 0-No pain                 Precious Haws Jaziah Goeller

## 2022-05-09 NOTE — Transfer of Care (Signed)
Immediate Anesthesia Transfer of Care Note  Patient: Tina Garcia  Procedure(s) Performed: SUCTION DILATATION AND CURETTAGE  Patient Location: PACU  Anesthesia Type:General  Level of Consciousness: awake, drowsy, and patient cooperative  Airway & Oxygen Therapy: Patient Spontanous Breathing and Patient connected to face mask oxygen  Post-op Assessment: Report given to RN and Post -op Vital signs reviewed and stable  Post vital signs: Reviewed and stable  Last Vitals:  Vitals Value Taken Time  BP 97/56 05/09/22 1130  Temp 36.1 C 05/09/22 1127  Pulse 81 05/09/22 1136  Resp 12 05/09/22 1136  SpO2 100 % 05/09/22 1136  Vitals shown include unvalidated device data.  Last Pain:  Vitals:   05/09/22 1127  TempSrc:   PainSc: Asleep         Complications: No notable events documented.

## 2022-05-09 NOTE — Discharge Instructions (Signed)
AMBULATORY SURGERY  ?DISCHARGE INSTRUCTIONS ? ? ?The drugs that you were given will stay in your system until tomorrow so for the next 24 hours you should not: ? ?Drive an automobile ?Make any legal decisions ?Drink any alcoholic beverage ? ? ?You may resume regular meals tomorrow.  Today it is better to start with liquids and gradually work up to solid foods. ? ?You may eat anything you prefer, but it is better to start with liquids, then soup and crackers, and gradually work up to solid foods. ? ? ?Please notify your doctor immediately if you have any unusual bleeding, trouble breathing, redness and pain at the surgery site, drainage, fever, or pain not relieved by medication. ? ? ? ?Additional Instructions: ? ? ? ?Please contact your physician with any problems or Same Day Surgery at [phone], Monday through Friday 6 am to 4 pm, or [facility] at [place] Main number at [phone].  ?

## 2022-05-09 NOTE — Op Note (Signed)
Tina Garcia, Tina Garcia MEDICAL RECORD NO: [id number] ACCOUNT NO: [id number] DATE OF BIRTH: 2000/04/25 FACILITY: [facility] LOCATION: [facility] PHYSICIAN: Boykin Nearing, MD  Operative Report   DATE OF PROCEDURE: 05/09/2022  PREOPERATIVE DIAGNOSIS:  Retained products of conception after elective termination.  POSTOPERATIVE DIAGNOSIS:  Retained products of conception after elective termination.  PROCEDURE:  Dilation and evacuation.  SURGEON:  Boykin Nearing, MD  ANESTHESIA:  General endotracheal anesthesia.  INDICATIONS:  The patient had an elective termination in mid November and had continued spotting and vaginal bleeding after the procedure.  Ultrasound shows a possible small amount of retained products of conception.  DESCRIPTION OF PROCEDURE:  After adequate general endotracheal anesthesia, the patient was placed in dorsal supine position with the legs in candy cane stirrups.  The patient's abdomen, perineum and vagina were prepped and draped in normal sterile  fashion.  The patient did receive 200 mg intravenous doxycycline for surgical prophylaxis.  Timeout was performed.  Straight catheterization of the bladder yielded 200 mL urine.  A weighted speculum was placed in the posterior vagina and the anterior  cervix was grasped with a single tooth tenaculum.  Cervix was dilated to #20 Hanks dilator without difficulty.  #7 flexible suction curette was placed into the endometrial cavity and a small amount of tissue consistent with products of conception were  removed.  Sharp curettage was performed with no additional tissue and good uterine cry.  A repeat suction curetting yielded no additional tissue.  Good hemostasis noted.  There were no complications.  ESTIMATED BLOOD LOSS:  5 mL.  INTRAOPERATIVE FLUIDS:  100 mL  URINE OUTPUT:  200 mL.  The patient did receive 30 mg intravenous Toradol at the end of the procedure.  She was taken to recovery room in good  condition.   PUS D: 05/09/2022 12:22:47 pm T: 05/09/2022 1:24:00 pm  JOB: [id number]/ [id number]

## 2022-05-09 NOTE — Anesthesia Procedure Notes (Signed)
Procedure Name: LMA Insertion Date/Time: 05/09/2022 10:45 AM  Performed by: Kelton Pillar, CRNAPre-anesthesia Checklist: Patient identified, Emergency Drugs available, Suction available and Patient being monitored Patient Re-evaluated:Patient Re-evaluated prior to induction Oxygen Delivery Method: Circle system utilized Preoxygenation: Pre-oxygenation with 100% oxygen Induction Type: IV induction Ventilation: Mask ventilation without difficulty LMA: LMA inserted LMA Size: 3.0 Placement Confirmation: positive ETCO2, CO2 detector and breath sounds checked- equal and bilateral Tube secured with: Tape Dental Injury: Teeth and Oropharynx as per pre-operative assessment

## 2022-05-10 ENCOUNTER — Encounter: Payer: Self-pay | Admitting: Obstetrics and Gynecology

## 2022-05-10 LAB — SURGICAL PATHOLOGY

## 2022-08-21 IMAGING — US US PELVIS COMPLETE TRANSABD/TRANSVAG W DUPLEX AND/OR DOPPLER
1 series · 15 of 25 positions shown · non-contrast
Comparison: CT examination dated 08/17/2021.

CLINICAL DATA: Pelvic pain for 2 days.



[Series 1: us transvaginal non-ob · 15 of 85 slices shown]
[im 1/85]
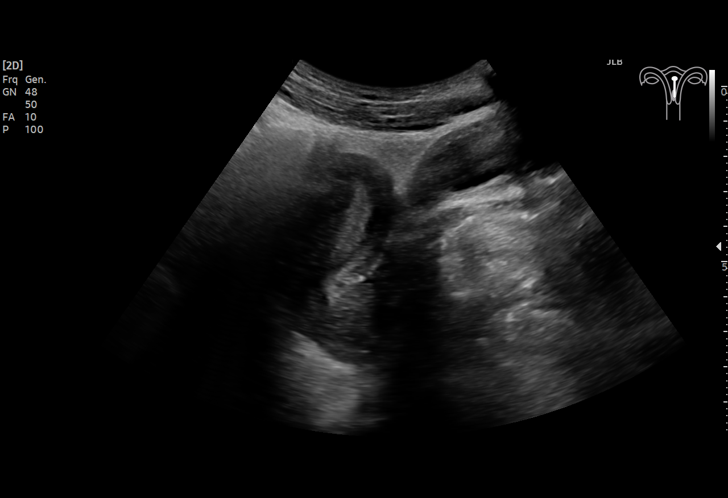
[im 8/85]
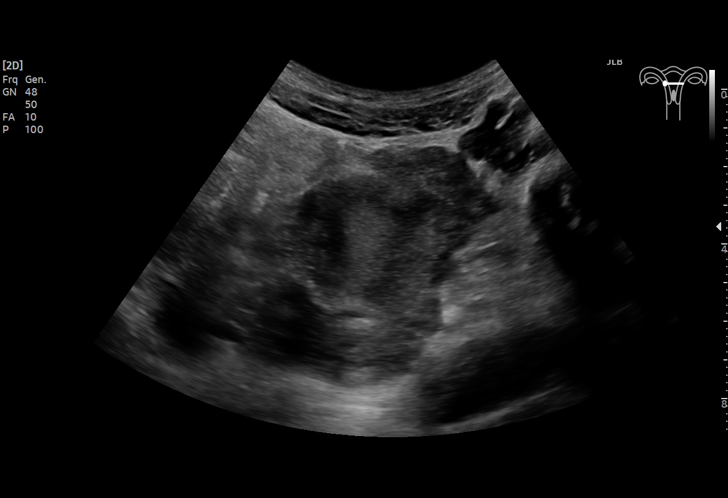
[im 15/85]
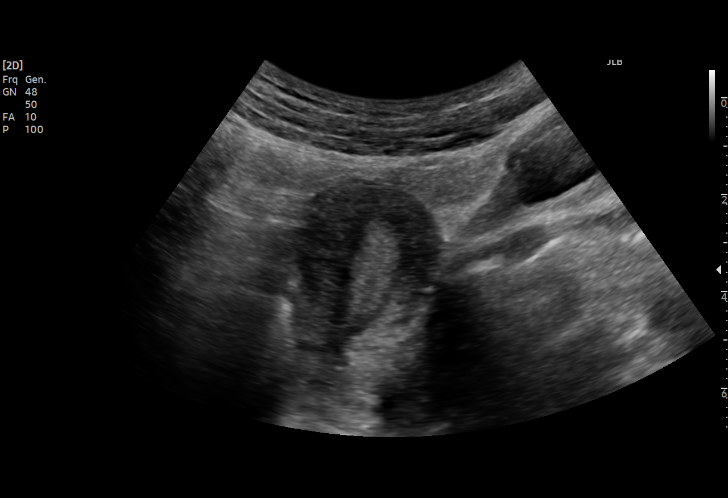
[im 18/85]
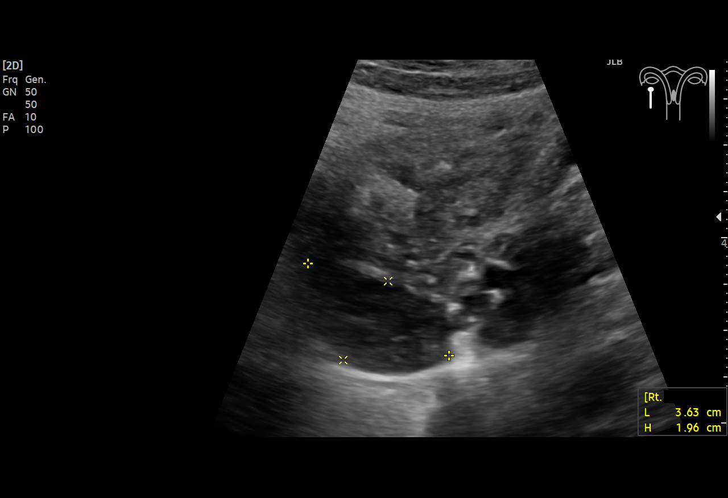
[im 25/85]
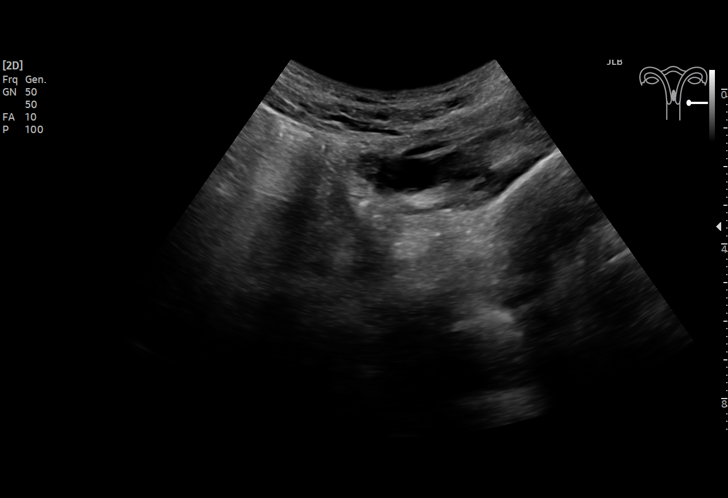
[im 32/85]
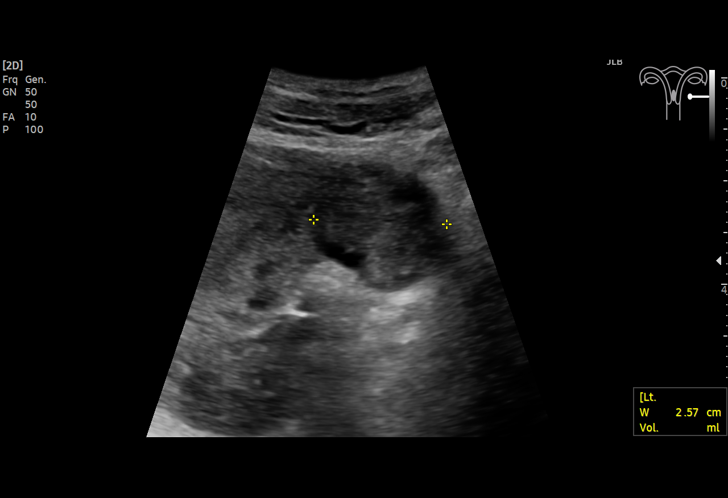
[im 36/85]
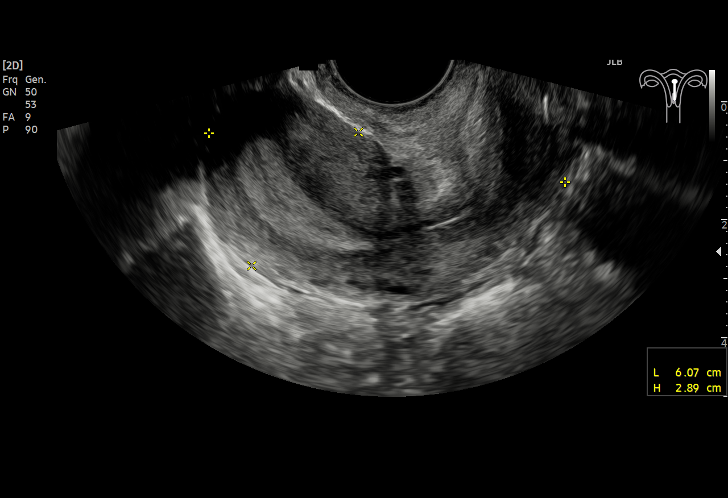
[im 43/85]
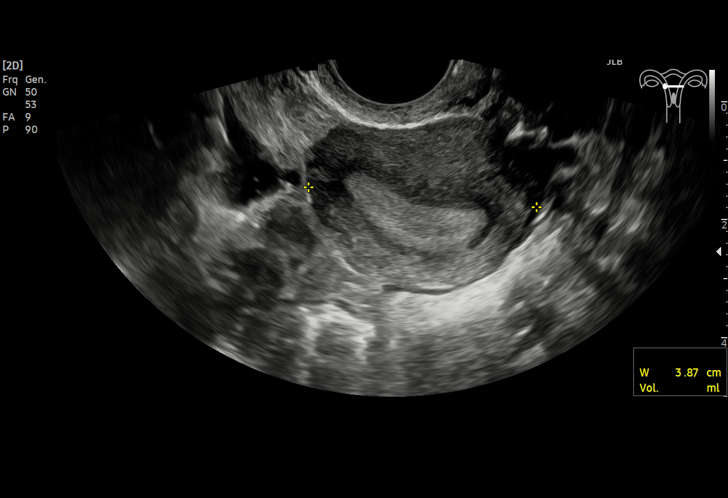
[im 50/85]
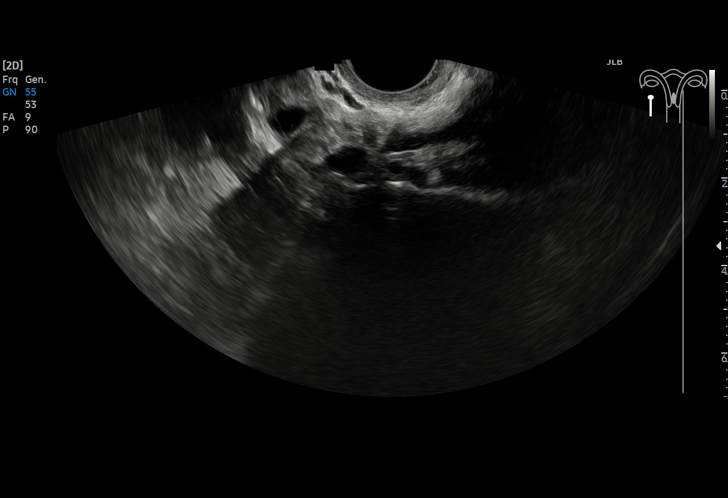
[im 53/85]
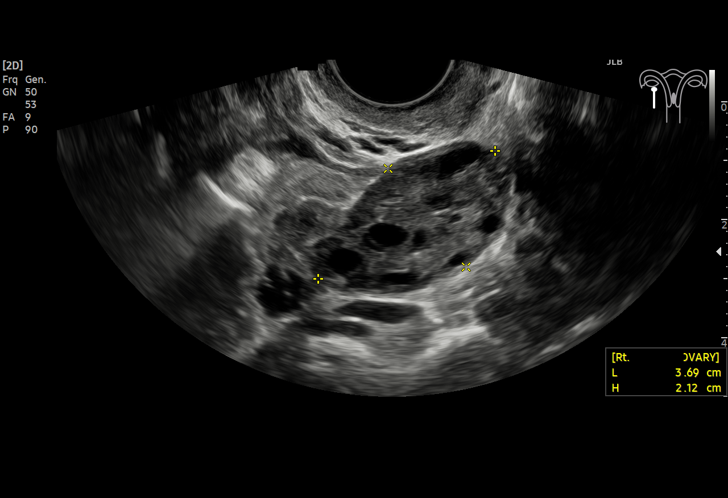
[im 60/85]
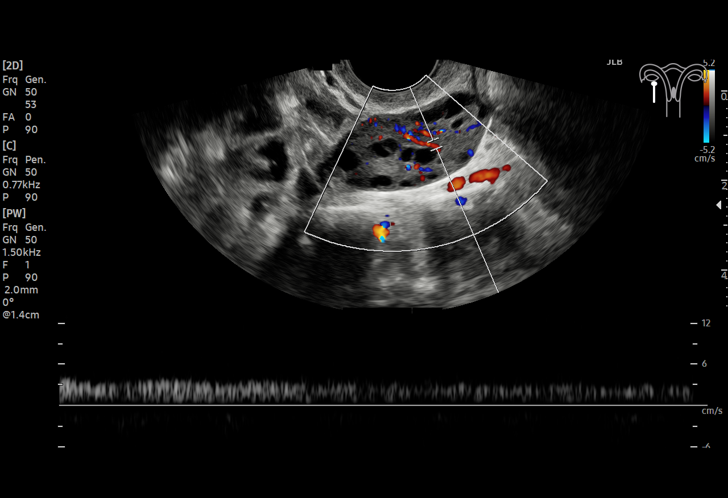
[im 67/85]
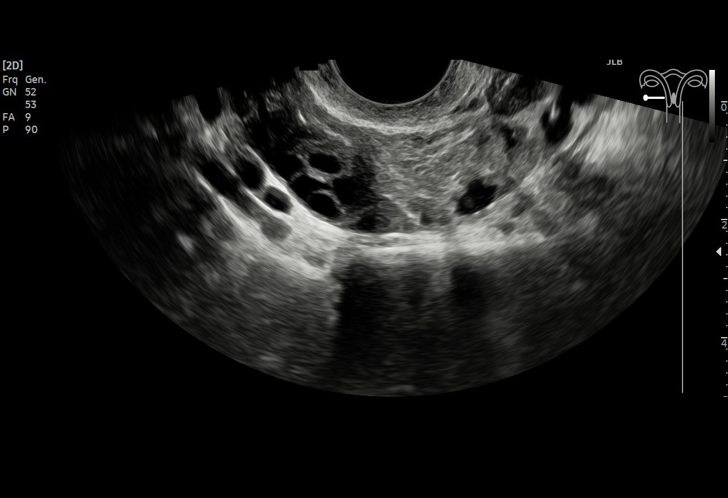
[im 71/85]
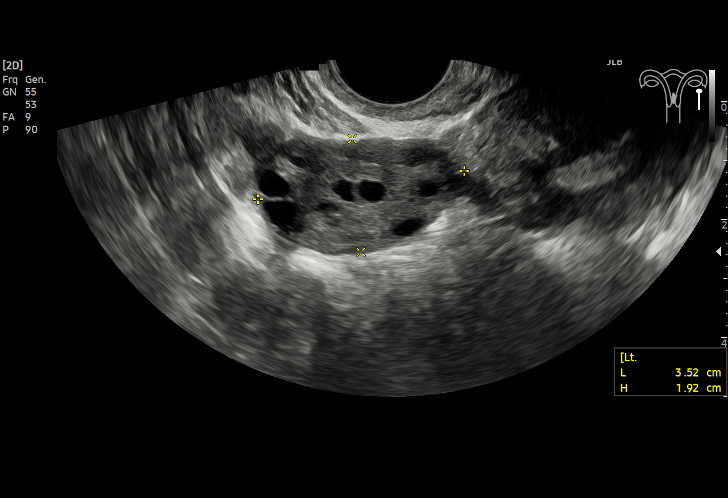
[im 78/85]
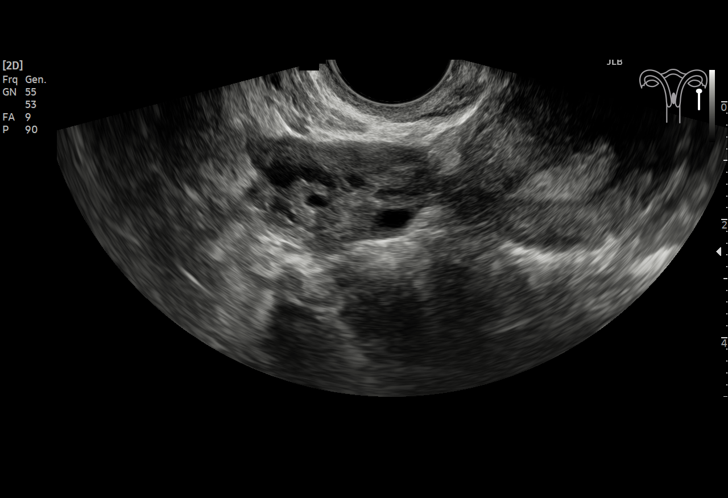
[im 85/85]
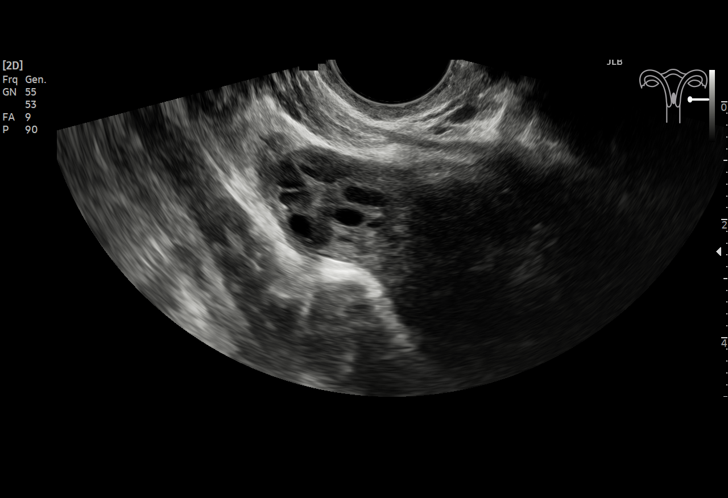

[15 of 25 positions shown; findings below may reference images not displayed]

FINDINGS: Uterus

Measurements: 6.1 x 2.9 x 3.9 = volume: 35.6 mL. No fibroids or
other mass visualized.

Endometrium

Thickness: 8 mm.  No focal abnormality visualized.

Right ovary

Measurements: 3.7 x 2.1 x 2.0 = volume: 8.1 mL. Normal appearance/no
adnexal mass.

Left ovary

Measurements: 3.5 x 1.9 x 2.9 10.2 = volume: 10.1 mL. Normal
appearance/no adnexal mass.

Pulsed Doppler evaluation of both ovaries demonstrates normal
low-resistance arterial and venous waveforms.

Other findings

No abnormal free fluid.
IMPRESSION: Normal examination of uterus and bilateral ovaries.

## 2022-08-21 IMAGING — CT CT ABD-PELV W/ CM
2 of 4 series · 16 of 46 positions shown, 18 images · IV contrast (APPLIED)
Comparison: April 04, 2014.

CLINICAL DATA: RIGHT lower quadrant abdominal pain in a 21-year-old
female.

EXAM:
CT ABDOMEN AND PELVIS WITH CONTRAST
TECHNIQUE: Multidetector CT imaging of the abdomen and pelvis was performed
using the standard protocol following bolus administration of
intravenous contrast.

[Series 2: abdomen 5.0 · axial · 0.56mm/px · z∈[-1095,-735]mm · 13 of 82 slices shown, 15 images]
[im 5/82  soft-tissue]
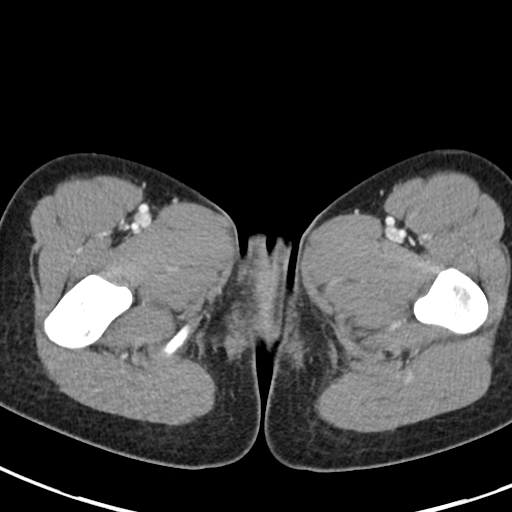
[im 5/82  bone]
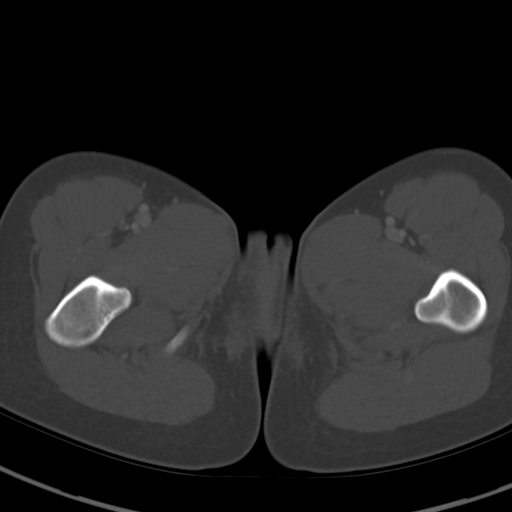
[im 10/82  soft-tissue]
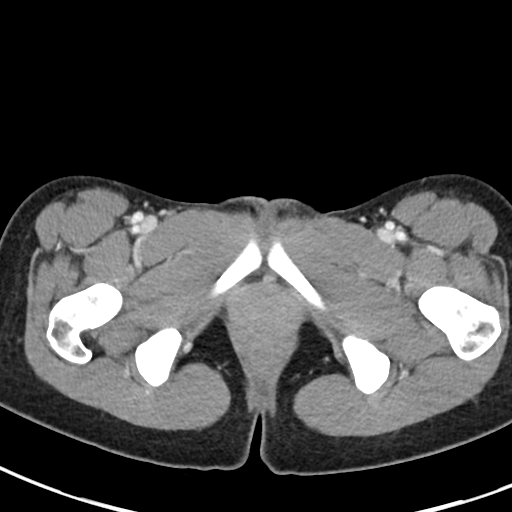
[im 19/82  soft-tissue]
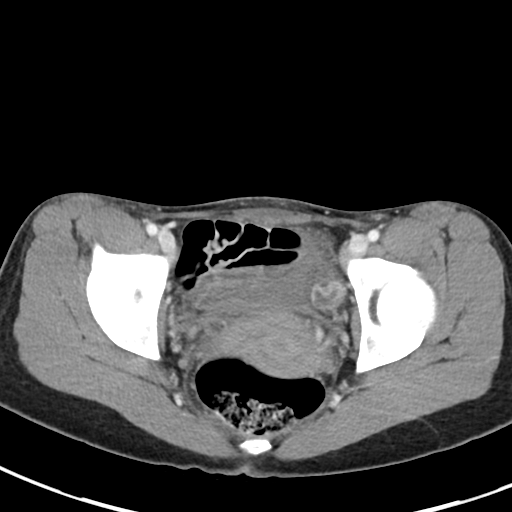
[im 23/82  soft-tissue]
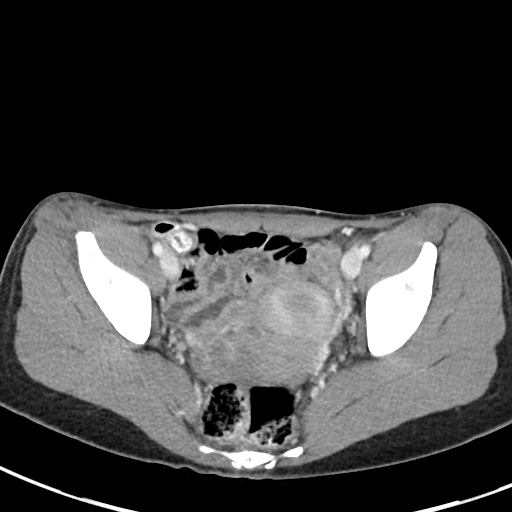
[im 28/82  soft-tissue]
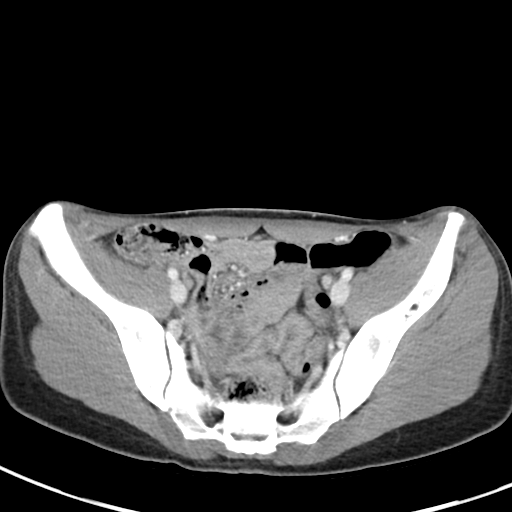
[im 37/82  soft-tissue]
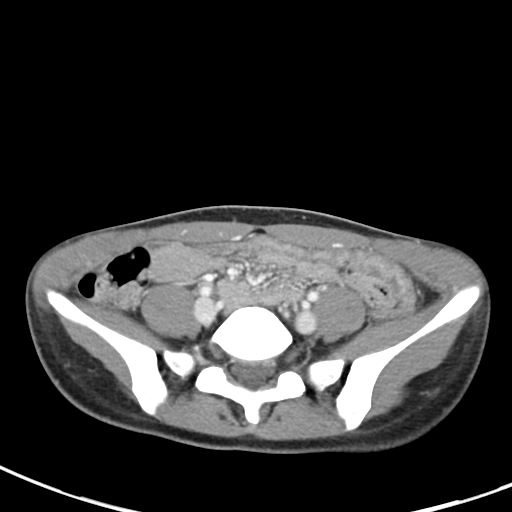
[im 41/82  soft-tissue]
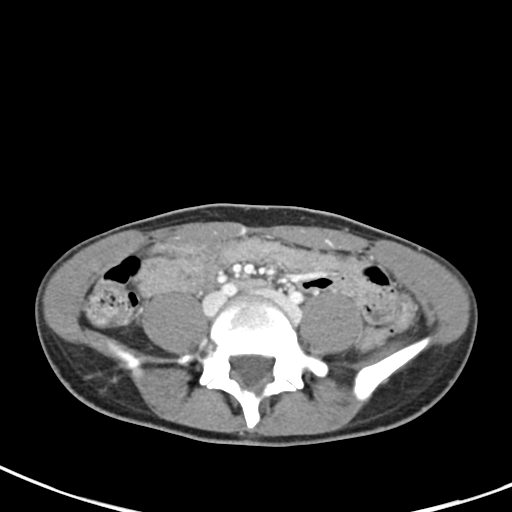
[im 46/82  soft-tissue]
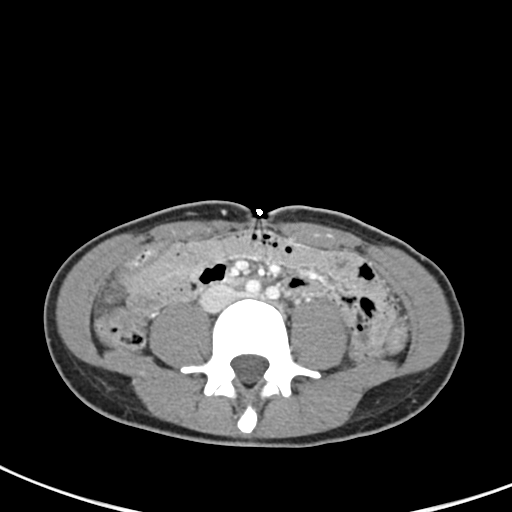
[im 55/82  soft-tissue]
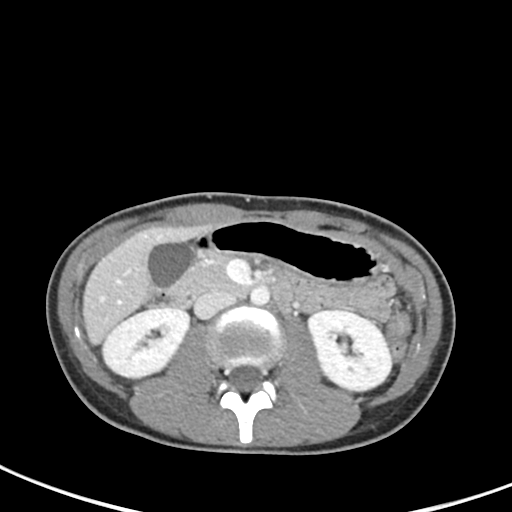
[im 55/82  bone]
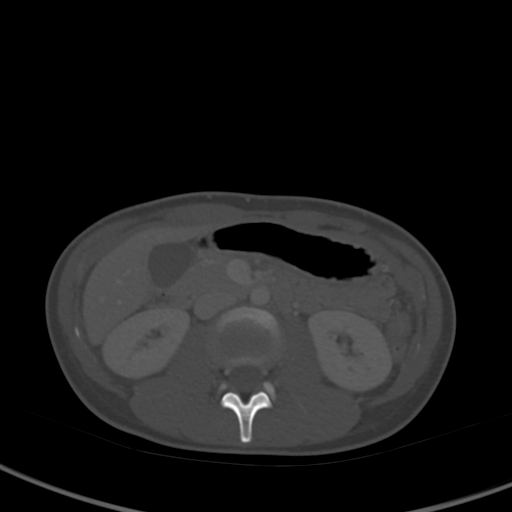
[im 59/82  soft-tissue]
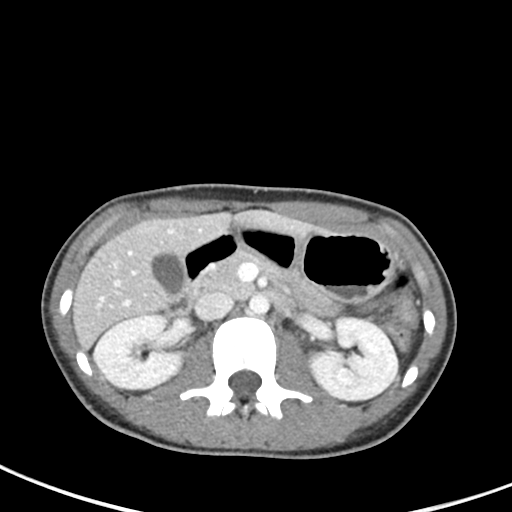
[im 64/82  soft-tissue]
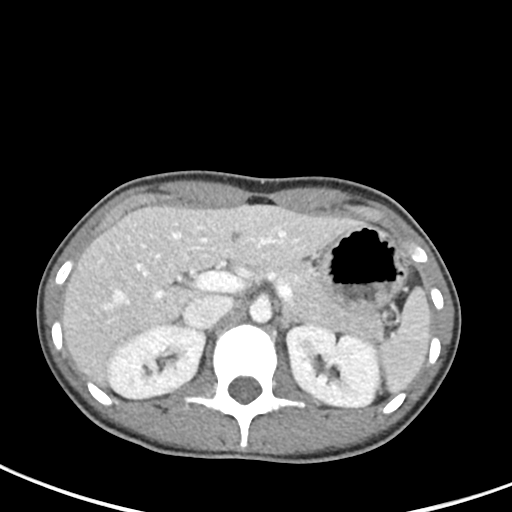
[im 73/82  soft-tissue]
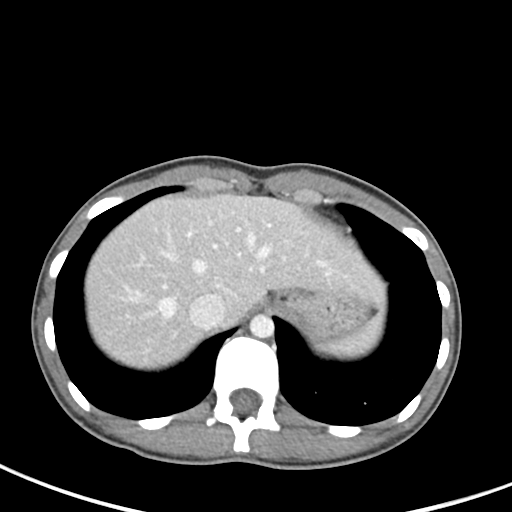
[im 77/82  soft-tissue]
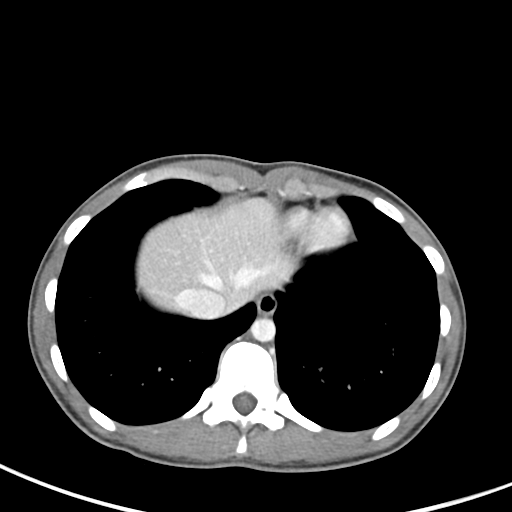

[Series 5: abdomen 3.0 mpr cor · coronal · 0.65mm/px · 3 of 68 slices shown]
[im 23/68  soft-tissue]
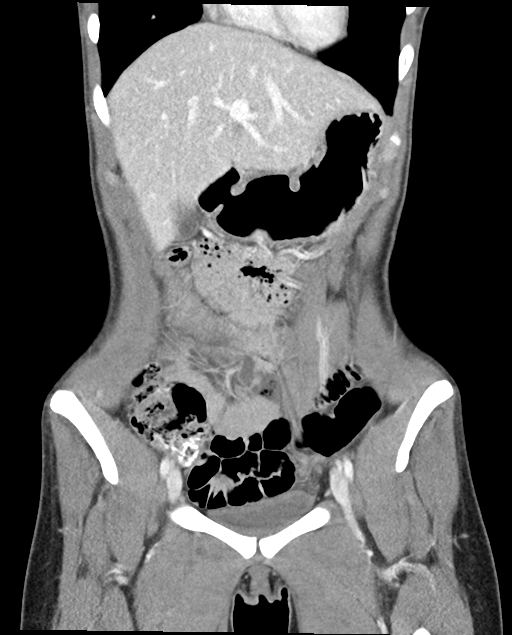
[im 30/68  soft-tissue]
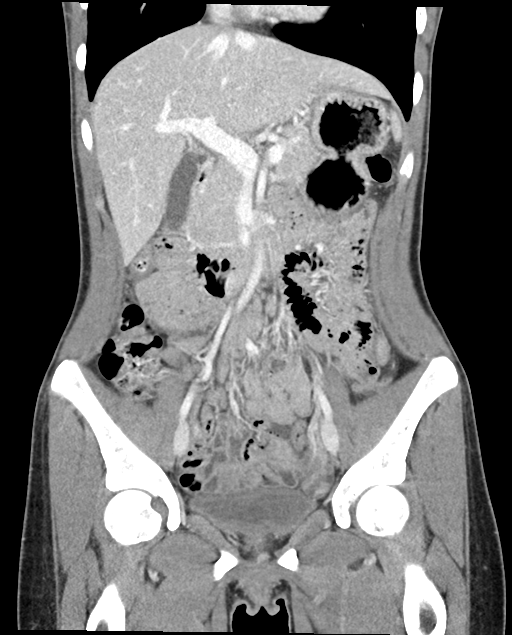
[im 38/68  soft-tissue]
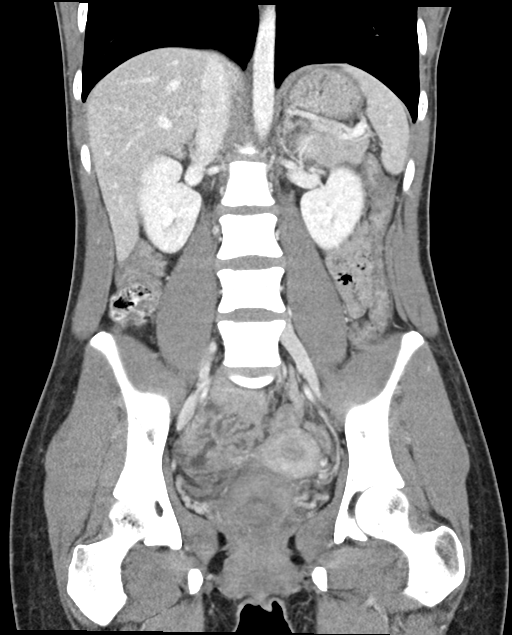

[16 of 46 positions shown; findings below may reference images not displayed]

RADIATION DOSE REDUCTION: This exam was performed according to the
departmental dose-optimization program which includes automated
exposure control, adjustment of the mA and/or kV according to
patient size and/or use of iterative reconstruction technique.

CONTRAST:  75mL OMNIPAQUE IOHEXOL 300 MG/ML  SOLN
FINDINGS: Lower chest: Lung bases are clear without effusion or sign of
consolidative changes.

Hepatobiliary: No focal, suspicious hepatic lesion. No
pericholecystic stranding. No biliary duct dilation. Portal vein is
patent.

Pancreas: Normal, without mass, inflammation or ductal dilatation.

Spleen: Normal.

Adrenals/Urinary Tract:

Adrenal glands are unremarkable. Symmetric renal enhancement. No
sign of hydronephrosis. No suspicious renal lesion or perinephric
stranding.

Urinary bladder is grossly unremarkable.

Stomach/Bowel: Paucity of mesenteric fat mildly limits assessment of
individual bowel loops. The appendix is not visible though there are
no secondary signs to suggest acute appendicitis or stranding in the
RIGHT lower quadrant. No small bowel dilation.

Vascular/Lymphatic:

Aorta with smooth contours. IVC with smooth contours. No aneurysmal
dilation of the abdominal aorta. There is no gastrohepatic or
hepatoduodenal ligament lymphadenopathy. No retroperitoneal or
mesenteric lymphadenopathy.

No pelvic sidewall lymphadenopathy.

Reproductive: Grossly unremarkable by CT.

Other: No ascites.  No pneumoperitoneum.

Musculoskeletal: No acute or significant osseous findings.
IMPRESSION: 1. No acute abnormality in the abdomen or pelvis.
